# Patient Record
Sex: Male | Born: 1980 | Race: White | Hispanic: No | Marital: Married | Smoking: Never smoker
Health system: Southern US, Community
[De-identification: ages and names within clinical notes are randomized; demographics above are authoritative.]

## PROBLEM LIST (undated history)

## (undated) DIAGNOSIS — F419 Anxiety disorder, unspecified: Secondary | ICD-10-CM

## (undated) DIAGNOSIS — K259 Gastric ulcer, unspecified as acute or chronic, without hemorrhage or perforation: Secondary | ICD-10-CM

## (undated) DIAGNOSIS — K589 Irritable bowel syndrome without diarrhea: Secondary | ICD-10-CM

## (undated) DIAGNOSIS — K219 Gastro-esophageal reflux disease without esophagitis: Secondary | ICD-10-CM

## (undated) HISTORY — DX: Gastric ulcer, unspecified as acute or chronic, without hemorrhage or perforation: K25.9

## (undated) HISTORY — DX: Anxiety disorder, unspecified: F41.9

## (undated) HISTORY — DX: Gastro-esophageal reflux disease without esophagitis: K21.9

## (undated) HISTORY — PX: TONSILLECTOMY: SUR1361

---

## 2006-01-08 ENCOUNTER — Other Ambulatory Visit: Payer: Self-pay

## 2006-01-08 ENCOUNTER — Emergency Department: Payer: Self-pay | Admitting: Emergency Medicine

## 2006-07-22 ENCOUNTER — Emergency Department: Payer: Self-pay | Admitting: Emergency Medicine

## 2006-07-24 ENCOUNTER — Ambulatory Visit: Payer: Self-pay | Admitting: Emergency Medicine

## 2007-07-01 ENCOUNTER — Emergency Department: Payer: Self-pay | Admitting: Emergency Medicine

## 2007-12-23 ENCOUNTER — Ambulatory Visit: Payer: Self-pay | Admitting: Urology

## 2008-02-05 ENCOUNTER — Emergency Department: Payer: Self-pay | Admitting: Emergency Medicine

## 2008-02-06 ENCOUNTER — Other Ambulatory Visit: Payer: Self-pay

## 2008-03-17 ENCOUNTER — Ambulatory Visit: Payer: Self-pay | Admitting: Internal Medicine

## 2008-06-05 ENCOUNTER — Observation Stay: Payer: Self-pay | Admitting: Internal Medicine

## 2008-07-21 ENCOUNTER — Ambulatory Visit: Payer: Self-pay | Admitting: Internal Medicine

## 2008-09-26 ENCOUNTER — Emergency Department: Payer: Self-pay | Admitting: Internal Medicine

## 2008-09-29 ENCOUNTER — Ambulatory Visit: Payer: Self-pay | Admitting: Internal Medicine

## 2008-10-04 ENCOUNTER — Ambulatory Visit: Payer: Self-pay | Admitting: Internal Medicine

## 2011-10-30 ENCOUNTER — Emergency Department: Payer: Self-pay | Admitting: Emergency Medicine

## 2015-10-03 ENCOUNTER — Other Ambulatory Visit: Payer: Self-pay | Admitting: Internal Medicine

## 2015-10-03 ENCOUNTER — Ambulatory Visit: Payer: Self-pay

## 2015-10-03 DIAGNOSIS — R1031 Right lower quadrant pain: Secondary | ICD-10-CM

## 2015-11-16 ENCOUNTER — Encounter: Payer: Self-pay | Admitting: Urology

## 2015-11-16 ENCOUNTER — Ambulatory Visit (INDEPENDENT_AMBULATORY_CARE_PROVIDER_SITE_OTHER): Payer: Medicaid Other | Admitting: Urology

## 2015-11-16 VITALS — BP 144/76 | HR 70 | Ht 72.0 in | Wt 199.0 lb

## 2015-11-16 DIAGNOSIS — N503 Cyst of epididymis: Secondary | ICD-10-CM | POA: Diagnosis not present

## 2015-11-16 DIAGNOSIS — K219 Gastro-esophageal reflux disease without esophagitis: Secondary | ICD-10-CM | POA: Insufficient documentation

## 2015-11-16 NOTE — Progress Notes (Signed)
11/16/2015 10:07 AM   Cory Young 06-11-1981 [id number]  Referring provider: No referring provider defined for this encounter.  Chief Complaint  Patient presents with  . Groin Swelling    New Patient    HPI: The patient is a 35 year old gentleman presents for evaluation of a small lesion of his left testicle. He states that he first noticed an approximate 4 months ago. He describes it as above the size of a piece of rice. He states that when he grasped that he feels that it moves around within the scrotum. For instance, he was able to find it below his testicle but then it will then move to the side of his testicle. He does not think this was there before. He is concerned this might be a tumor. He also has been told that he has a right epididymal cyst. He has no complaints of pain from his testicles. 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   PMH: Past Medical History  Diagnosis Date  . GERD (gastroesophageal reflux disease)   . Anxiety   . Stomach ulcer     Surgical History: Past Surgical History  Procedure Laterality Date  . Tonsillectomy      Home Medications:    Medication List       This list is accurate as of: 11/16/15 10:07 AM.  Always use your most recent med list.               clonazePAM 1 MG tablet  Commonly known as:  KLONOPIN  Take 1 mg by mouth 2 (two) times daily as needed.     MULTI-VITAMINS Tabs  Take by mouth.     pantoprazole 40 MG tablet  Commonly known as:  PROTONIX  TAKE 1 TABLET (40 MG TOTAL) BY MOUTH ONCE DAILY.        Allergies:  Allergies  Allergen Reactions  . Penicillin G Anaphylaxis  . Amoxicillin     Other reaction(s): Unknown    Family History: Family History  Problem Relation Age of Onset  . Kidney cancer Neg Hx   . Prostate cancer Neg Hx   . Bladder Cancer Neg Hx   . Breast cancer Mother     Social History:  reports that he has never smoked.  He does not have any smokeless tobacco history on file. He reports that he does not drink alcohol or use illicit drugs.  ROS: UROLOGY Frequent Urination?: No Hard to postpone urination?: No Burning/pain with urination?: No Get up at night to urinate?: No Leakage of urine?: No Urine stream starts and stops?: No Trouble starting stream?: No Do you have to strain to urinate?: No Blood in urine?: No Urinary tract infection?: No Sexually transmitted disease?: No Injury to kidneys or bladder?: No Painful intercourse?: No Weak stream?: No Erection problems?: No Penile pain?: No  Gastrointestinal Nausea?: No Vomiting?: No Indigestion/heartburn?: Yes Diarrhea?: No Constipation?: No  Constitutional Fever: No Night sweats?: No Weight loss?: No Fatigue?: No  Skin Skin rash/lesions?: No Itching?: No  Eyes Blurred vision?: No Double vision?: No  Ears/Nose/Throat Sore throat?: No Sinus problems?: No  Hematologic/Lymphatic Swollen glands?: No Easy bruising?: No  Cardiovascular Leg swelling?: No Chest pain?: No  Respiratory Cough?: No Shortness of breath?: No  Endocrine Excessive thirst?: No  Musculoskeletal Back pain?: No Joint pain?: No  Neurological Headaches?: No Dizziness?: No  Psychologic Depression?: No Anxiety?: No  Physical Exam: BP 144/76  mmHg  Pulse 70  Ht 6' (1.829 m)  Wt 199 lb (90.266 kg)  BMI 26.98 kg/m2  Constitutional:  Alert and oriented, No acute distress. HEENT: [place] AT, moist mucus membranes.  Trachea midline, no masses. Cardiovascular: No clubbing, cyanosis, or edema. Respiratory: Normal respiratory effort, no increased work of breathing. GI: Abdomen is soft, nontender, nondistended, no abdominal masses GU: No CVA tenderness. Normal phallus. Testicles descended equally bilaterally. Small right epididymal cyst noted. I'm unable to palpate the lesion that he is concerned about in his left hemiscrotum. Skin: No rashes, bruises or  suspicious lesions. Lymph: No cervical or inguinal adenopathy. Neurologic: Grossly intact, no focal deficits, moving all 4 extremities. Psychiatric: Normal mood and affect.  Laboratory Data: No results found for: WBC, HGB, HCT, MCV, PLT  No results found for: CREATININE  No results found for: PSA  No results found for: TESTOSTERONE  No results found for: HGBA1C  Urinalysis No results found for: COLORURINE, APPEARANCEUR, LABSPEC, PHURINE, GLUCOSEU, HGBUR, BILIRUBINUR, KETONESUR, PROTEINUR, UROBILINOGEN, NITRITE, LEUKOCYTESUR   Assessment & Plan:    1. Right epididymal cyst 2. Questionable left extratesticular lesion I am unable to palpate the lesion in the left hemiscrotum the patient is concerned about. I will order a scrotal ultrasound at this time for reassurance. The patient will follow-up after the ultrasound.  Return in about 2 weeks (around 11/30/2015) for scrotal u/s prior.  Hildred LaserBrian James Harlie Buening, MD  [facility] [address], [address] [place], [place] [address] [phone]

## 2015-11-23 ENCOUNTER — Ambulatory Visit: Payer: Medicaid Other

## 2015-12-07 ENCOUNTER — Ambulatory Visit: Payer: Medicaid Other

## 2015-12-13 ENCOUNTER — Ambulatory Visit
Admission: RE | Admit: 2015-12-13 | Discharge: 2015-12-13 | Disposition: A | Discharge status: Home / Self Care | Payer: Medicaid Other | Source: Ambulatory Visit | Attending: Urology | Admitting: Urology

## 2015-12-13 DIAGNOSIS — N503 Cyst of epididymis: Secondary | ICD-10-CM | POA: Diagnosis present

## 2015-12-13 DIAGNOSIS — N433 Hydrocele, unspecified: Secondary | ICD-10-CM | POA: Diagnosis not present

## 2015-12-14 ENCOUNTER — Ambulatory Visit (INDEPENDENT_AMBULATORY_CARE_PROVIDER_SITE_OTHER): Payer: Medicaid Other | Admitting: Urology

## 2015-12-14 ENCOUNTER — Encounter: Payer: Self-pay | Admitting: Urology

## 2015-12-14 VITALS — BP 112/69 | HR 61 | Ht 72.0 in | Wt 200.2 lb

## 2015-12-14 DIAGNOSIS — N433 Hydrocele, unspecified: Secondary | ICD-10-CM | POA: Diagnosis not present

## 2015-12-14 NOTE — Progress Notes (Signed)
12/14/2015 9:35 AM   Cory Young [birth year] [id number]  Referring provider: Danella PentonMark F Miller, MD [phone] [facility] [place], [place] [id number]  Chief Complaint  Patient presents with  . Follow-up    US results     HPI: The patient is a 35 year old gentleman presents for evaluation of a small lesion of his left testicle. He states that he first noticed an approximate 4 months ago. He describes it as above the size of a piece of rice. He states that when he grasped that he feels that it moves around within the scrotum. For instance, he was able to find it below his testicle but then it will then move to the side of his testicle. He does not think this was there before. He is concerned this might be a tumor. He also has been told that he has a right epididymal cyst. He has no complaints of pain from his testicles. 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   The patient returns today after undergoing a scrotal ultrasound which was unremarkable.  His physical exam at his last plan was unremarkable.   PMH: Past Medical History  Diagnosis Date  . GERD (gastroesophageal reflux disease)   . Anxiety   . Stomach ulcer     Surgical History: Past Surgical History  Procedure Laterality Date  . Tonsillectomy      Home Medications:    Medication List       This list is accurate as of: 12/14/15  9:35 AM.  Always use your most recent med list.               clonazePAM 1 MG tablet  Commonly known as:  KLONOPIN  Take 1 mg by mouth 2 (two) times daily as needed.     MULTI-VITAMINS Tabs  Take by mouth.     pantoprazole 40 MG tablet  Commonly known as:  PROTONIX  TAKE 1 TABLET (40 MG TOTAL) BY MOUTH ONCE DAILY.        Allergies:  Allergies  Allergen Reactions  . Penicillin G Anaphylaxis  . Amoxicillin     Other reaction(s): Unknown  . Citalopram Other (See Comments)   Nightmares, sexual dysfunction.   . Sulfur Other (See Comments)    Blisters on throat    Family History: Family History  Problem Relation Age of Onset  . Kidney cancer Neg Hx   . Prostate cancer Neg Hx   . Bladder Cancer Neg Hx   . Breast cancer Mother     Social History:  reports that he has never smoked. He does not have any smokeless tobacco history on file. He reports that he does not drink alcohol or use illicit drugs.  ROS: UROLOGY Frequent Urination?: No Hard to postpone urination?: No Burning/pain with urination?: No Get up at night to urinate?: No Leakage of urine?: No Urine stream starts and stops?: No Trouble starting stream?: No Do you have to strain to urinate?: No Blood in urine?: No Urinary tract infection?: No Sexually transmitted disease?: No Injury to kidneys or bladder?: No Painful intercourse?: No Weak stream?: No Erection problems?: No Penile pain?: No  Gastrointestinal Nausea?: No Vomiting?: No Indigestion/heartburn?: Yes Diarrhea?: No Constipation?: No  Constitutional Fever: No Night sweats?: No Weight loss?: No Fatigue?: No  Skin Skin rash/lesions?: No Itching?: No  Eyes Blurred vision?: No Double vision?: No  Ears/Nose/Throat Sore throat?: No Sinus  problems?: No  Hematologic/Lymphatic Swollen glands?: No Easy bruising?: No  Cardiovascular Leg swelling?: No Chest pain?: No  Respiratory Cough?: No Shortness of breath?: No  Endocrine Excessive thirst?: No  Musculoskeletal Back pain?: No Joint pain?: No  Neurological Headaches?: No Dizziness?: No  Psychologic Depression?: No Anxiety?: No  Physical Exam: BP 112/69 mmHg  Pulse 61  Ht 6' (1.829 m)  Wt 200 lb 3.2 oz (90.81 kg)  BMI 27.15 kg/m2  Constitutional:  Alert and oriented, No acute distress. HEENT: [place] AT, moist mucus membranes.  Trachea midline, no masses. Cardiovascular: No clubbing, cyanosis, or edema. Respiratory: Normal respiratory effort, no  increased work of breathing. GI: Abdomen is soft, nontender, nondistended, no abdominal masses GU: No CVA tenderness.  Skin: No rashes, bruises or suspicious lesions. Lymph: No cervical or inguinal adenopathy. Neurologic: Grossly intact, no focal deficits, moving all 4 extremities. Psychiatric: Normal mood and affect.  Laboratory Data: No results found for: WBC, HGB, HCT, MCV, PLT  No results found for: CREATININE  No results found for: PSA  No results found for: TESTOSTERONE  No results found for: HGBA1C  Urinalysis No results found for: COLORURINE, APPEARANCEUR, LABSPEC, PHURINE, GLUCOSEU, HGBUR, BILIRUBINUR, KETONESUR, PROTEINUR, UROBILINOGEN, NITRITE, LEUKOCYTESUR  Pertinent Imaging: CLINICAL DATA: Epididymal cyst.  EXAM: SCROTAL ULTRASOUND  DOPPLER ULTRASOUND OF THE TESTICLES  TECHNIQUE: Complete ultrasound examination of the testicles, epididymis, and other scrotal structures was performed. Color and spectral Doppler ultrasound were also utilized to evaluate blood flow to the testicles.  COMPARISON: CT 09/29/2008. Ultrasound 12/22/2008.  FINDINGS: Right testicle  Measurements: 5.2 x 2.4 x 3.5 cm. No mass or microlithiasis visualized.  Left testicle  Measurements: 4.9 x 2.4 x 2.9 cm. No mass or microlithiasis visualized.  Right epididymis: Normal in size and appearance.  Left epididymis: Normal in size and appearance.  Hydrocele: Small left hydrocele. Left appendix testes noted.  Varicocele: None visualized.  Pulsed Doppler interrogation of both testes demonstrates normal low resistance arterial and venous waveforms bilaterally.  IMPRESSION: Small left hydrocele, otherwise negative exam.  Assessment & Plan:   1. Left hydrocele The patient's exam as well as scrotal ultrasound was unremarkable. There is no evidence of malignancy or other abnormalities besides a small left hydrocele. No further workup is needed. He can follow  up with [place] as needed.  No Follow-up on file.  Hildred Laser, MD  [facility] [address], [address] [place], [place] [address] [phone]

## 2016-03-27 ENCOUNTER — Encounter: Payer: Self-pay | Admitting: *Deleted

## 2016-03-28 ENCOUNTER — Ambulatory Visit: Payer: Medicaid Other | Admitting: Anesthesiology

## 2016-03-28 ENCOUNTER — Ambulatory Visit
Admission: RE | Admit: 2016-03-28 | Discharge: 2016-03-28 | Disposition: A | Discharge status: Home / Self Care | Payer: Medicaid Other | Source: Ambulatory Visit | Attending: Gastroenterology | Admitting: Gastroenterology

## 2016-03-28 ENCOUNTER — Encounter: Payer: Self-pay | Admitting: Gastroenterology

## 2016-03-28 ENCOUNTER — Encounter
Admission: RE | Disposition: A | Discharge status: Home / Self Care | Payer: Self-pay | Source: Ambulatory Visit | Attending: Gastroenterology

## 2016-03-28 DIAGNOSIS — Z9109 Other allergy status, other than to drugs and biological substances: Secondary | ICD-10-CM | POA: Insufficient documentation

## 2016-03-28 DIAGNOSIS — Z8711 Personal history of peptic ulcer disease: Secondary | ICD-10-CM | POA: Insufficient documentation

## 2016-03-28 DIAGNOSIS — R1031 Right lower quadrant pain: Secondary | ICD-10-CM | POA: Diagnosis present

## 2016-03-28 DIAGNOSIS — R1011 Right upper quadrant pain: Secondary | ICD-10-CM | POA: Insufficient documentation

## 2016-03-28 DIAGNOSIS — K589 Irritable bowel syndrome without diarrhea: Secondary | ICD-10-CM | POA: Diagnosis not present

## 2016-03-28 DIAGNOSIS — K625 Hemorrhage of anus and rectum: Secondary | ICD-10-CM | POA: Diagnosis present

## 2016-03-28 DIAGNOSIS — Z79899 Other long term (current) drug therapy: Secondary | ICD-10-CM | POA: Insufficient documentation

## 2016-03-28 DIAGNOSIS — Z880 Allergy status to penicillin: Secondary | ICD-10-CM | POA: Insufficient documentation

## 2016-03-28 DIAGNOSIS — K635 Polyp of colon: Secondary | ICD-10-CM | POA: Insufficient documentation

## 2016-03-28 DIAGNOSIS — K219 Gastro-esophageal reflux disease without esophagitis: Secondary | ICD-10-CM | POA: Insufficient documentation

## 2016-03-28 DIAGNOSIS — Z882 Allergy status to sulfonamides status: Secondary | ICD-10-CM | POA: Insufficient documentation

## 2016-03-28 DIAGNOSIS — F419 Anxiety disorder, unspecified: Secondary | ICD-10-CM | POA: Diagnosis not present

## 2016-03-28 HISTORY — DX: Irritable bowel syndrome, unspecified: K58.9

## 2016-03-28 HISTORY — PX: COLONOSCOPY WITH PROPOFOL: SHX5780

## 2016-03-28 SURGERY — COLONOSCOPY WITH PROPOFOL
Anesthesia: General

## 2016-03-28 MED ORDER — PROPOFOL 10 MG/ML IV BOLUS
INTRAVENOUS | Status: DC | PRN
  Administered 2016-03-28: 40 mg via INTRAVENOUS
  Administered 2016-03-28: 20 mg via INTRAVENOUS
  Administered 2016-03-28: 30 mg via INTRAVENOUS

## 2016-03-28 MED ORDER — FENTANYL CITRATE (PF) 100 MCG/2ML IJ SOLN
INTRAMUSCULAR | Status: DC | PRN
  Administered 2016-03-28 (×2): 50 ug via INTRAVENOUS

## 2016-03-28 MED ORDER — SODIUM CHLORIDE 0.9 % IV SOLN
INTRAVENOUS | Status: DC
  Administered 2016-03-28: 1000 mL via INTRAVENOUS
  Administered 2016-03-28: 13:00:00 via INTRAVENOUS

## 2016-03-28 MED ORDER — MIDAZOLAM HCL 2 MG/2ML IJ SOLN
INTRAMUSCULAR | Status: DC | PRN
  Administered 2016-03-28: 1 mg via INTRAVENOUS

## 2016-03-28 MED ORDER — SODIUM CHLORIDE 0.9 % IV SOLN: INTRAVENOUS | Status: DC

## 2016-03-28 MED ORDER — PROPOFOL 500 MG/50ML IV EMUL
INTRAVENOUS | Status: DC | PRN
  Administered 2016-03-28: 130 ug/kg/min via INTRAVENOUS

## 2016-03-28 NOTE — H&P (Signed)
Outpatient short stay form Pre-procedure 03/28/2016 12:21 PM Cory Young Shamya Macfadden MD  Primary Physician: Dr. Bethann PunchesMark Miller  Reason for visit:  Colonoscopy  History of present illness:  Patient is a 35 year old male presenting today as above. As a history of an episode of rectal bleeding several months ago accompanied by right upper quadrant to lower quadrant pain. It's occasional pain however has not seen a repeat bleeding. He has had a colonoscopy in the past and had an adenoma removed at that time. There is no family history of colon cancer. He tolerated his prep well. He takes no blood thinning medications or aspirin products.    Current Facility-Administered Medications:  .  0.9 %  sodium chloride infusion, , Intravenous, Continuous, Cory Young Orenthal Debski, MD, Last Rate: 20 mL/hr at 03/28/16 1045, 1,000 mL at 03/28/16 1045 .  0.9 %  sodium chloride infusion, , Intravenous, Continuous, Cory Young Arjay Jaskiewicz, MD  Prescriptions Prior to Admission  Medication Sig Dispense Refill Last Dose  . clonazePAM (KLONOPIN) 1 MG tablet Take 1 mg by mouth 2 (two) times daily as needed.  5 Past Week at Unknown time  . Multiple Vitamin (MULTI-VITAMINS) TABS Take by mouth.   Past Week at Unknown time  . pantoprazole (PROTONIX) 40 MG tablet TAKE 1 TABLET (40 MG TOTAL) BY MOUTH ONCE DAILY.  5 Past Week at Unknown time     Allergies  Allergen Reactions  . Penicillin G Anaphylaxis  . Amoxicillin     Other reaction(s): Unknown  . Citalopram Other (See Comments)    Nightmares, sexual dysfunction.   Marland Kitchen. Sulfur Other (See Comments)    Blisters on throat     Past Medical History:  Diagnosis Date  . Anxiety   . GERD (gastroesophageal reflux disease)   . IBS (irritable bowel syndrome)   . Stomach ulcer     Review of systems:      Physical Exam    Heart and lungs: Regular rate and rhythm without rub or gallop, lungs are bilaterally clear.    HEENT: Normocephalic atraumatic eyes are anicteric    Other:     Pertinant exam for procedure: Soft nontender nondistended bowel sounds positive normoactive.    Planned proceedures: Colonoscopy and indicated procedures. I have discussed the risks benefits and complications of procedures to include not limited to bleeding, infection, perforation and the risk of sedation and the patient wishes to proceed.    Cory Young Skyla Champagne, MD Gastroenterology 03/28/2016  12:21 PM

## 2016-03-28 NOTE — Anesthesia Preprocedure Evaluation (Addendum)
Anesthesia Evaluation  Patient identified by MRN, date of birth, ID band Patient awake    Reviewed: Allergy & Precautions, NPO status , Patient's Chart, lab work & pertinent test results  History of Anesthesia Complications Negative for: history of anesthetic complications  Airway Mallampati: II       Dental  (+) Teeth Intact   Pulmonary neg pulmonary ROS,    Pulmonary exam normal        Cardiovascular Exercise Tolerance: Good negative cardio ROS   Rhythm:Regular Rate:Normal     Neuro/Psych Anxiety negative neurological ROS     GI/Hepatic Neg liver ROS, GERD  Medicated,  Endo/Other  negative endocrine ROS  Renal/GU negative Renal ROS     Musculoskeletal negative musculoskeletal ROS (+)   Abdominal Normal abdominal exam  (+)   Peds negative pediatric ROS (+)  Hematology negative hematology ROS (+)   Anesthesia Other Findings   Reproductive/Obstetrics                             Anesthesia Physical Anesthesia Plan  ASA: II  Anesthesia Plan: General   Post-op Pain Management:    Induction: Intravenous  Airway Management Planned: Natural Airway and Nasal Cannula  Additional Equipment:   Intra-op Plan:   Post-operative Plan:   Informed Consent: I have reviewed the patients History and Physical, chart, labs and discussed the procedure including the risks, benefits and alternatives for the proposed anesthesia with the patient or authorized representative who has indicated his/her understanding and acceptance.     Plan Discussed with:   Anesthesia Plan Comments:         Anesthesia Quick Evaluation

## 2016-03-28 NOTE — Anesthesia Postprocedure Evaluation (Signed)
Anesthesia Post Note  Patient: Sarajane JewsSteven M Aaberg  Procedure(s) Performed: Procedure(s) (LRB): COLONOSCOPY WITH PROPOFOL (N/A)  Patient location during evaluation: PACU Anesthesia Type: General Level of consciousness: awake Pain management: pain level controlled Vital Signs Assessment: post-procedure vital signs reviewed and stable Respiratory status: spontaneous breathing Cardiovascular status: stable Anesthetic complications: no    Last Vitals:  Vitals:   03/28/16 1320 03/28/16 1330  BP: 126/81 127/88  Pulse: (!) 58 (!) 54  Resp: 13 12  Temp:      Last Pain:  Vitals:   03/28/16 1310  TempSrc: Oral                 VAN STAVEREN,Nils Thor

## 2016-03-28 NOTE — Transfer of Care (Signed)
Immediate Anesthesia Transfer of Care Note  Patient: Cory Young  Procedure(s) Performed: Procedure(s): COLONOSCOPY WITH PROPOFOL (N/A)  Patient Location: PACU  Anesthesia Type:General  Level of Consciousness: awake, alert  and oriented  Airway & Oxygen Therapy: Patient Spontanous Breathing and Patient connected to nasal cannula oxygen  Post-op Assessment: Report given to RN and Post -op Vital signs reviewed and stable  Post vital signs: Reviewed  Last Vitals:  Vitals:   03/28/16 1024  BP: 135/83  Pulse: 73  Resp: 16  Temp: 36.2 C    Last Pain:  Vitals:   03/28/16 1024  TempSrc: Tympanic         Complications: No apparent anesthesia complications

## 2016-03-28 NOTE — Op Note (Signed)
[facility] Gastroenterology Patient Name: Cory Young Procedure Date: 03/28/2016 12:24 PM MRN: [id number] Account #: [id number] Date of Birth: [birth year] Admit Type: Outpatient Age: 35 Room: [facility] ENDO ROOM 4 Gender: Male Note Status: Finalized Procedure:            Colonoscopy Indications:          Abdominal pain in the right lower quadrant, Abdominal                        pain in the right upper quadrant, Rectal bleeding Providers:            Christena Deem, MD Referring MD:         Danella Penton, MD (Referring MD) Medicines:            Monitored Anesthesia Care Complications:        No immediate complications. Procedure:            Pre-Anesthesia Assessment:                       - ASA Grade Assessment: II - A patient with mild                        systemic disease.                       After obtaining informed consent, the colonoscope was                        passed under direct vision. Throughout the procedure,                        the patient's blood pressure, pulse, and oxygen                        saturations were monitored continuously. The                        Colonoscope was introduced through the anus and                        advanced to the the cecum, identified by appendiceal                        orifice and ileocecal valve. The patient tolerated the                        procedure well. The quality of the bowel preparation                        was good. Findings:      A 2 mm polyp was found in the appendiceal orifice. The polyp was       sessile. The polyp was removed with a cold biopsy forceps. Resection and       retrieval were complete.      The exam was otherwise normal throughout the examined colon.      The retroflexed view of the distal rectum and anal verge was normal and       showed no anal or rectal abnormalities.      The digital rectal exam was normal.  The terminal ileum appeared normal. Impression:            - One 2 mm polyp at the appendiceal orifice, removed                        with a cold biopsy forceps. Resected and retrieved.                       - The distal rectum and anal verge are normal on                        retroflexion view. Recommendation:       - Use Citrucel one tablespoon PO daily daily.                       - Return to GI clinic in 1 month. 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 All rights reserved. The codes documented in this report are preliminary and upon coder review may  be revised to meet current compliance requirements. Christena DeemMartin U Tisa Weisel, MD 03/28/2016 1:10:03 PM This report has been signed electronically. Number of Addenda: 0 Note Initiated On: 03/28/2016 12:24 PM Scope Withdrawal Time: 0 hours 15 minutes 48 seconds  Total Procedure Duration: 0 hours 26 minutes 13 seconds       [facility]

## 2016-03-29 LAB — SURGICAL PATHOLOGY

## 2016-11-02 ENCOUNTER — Emergency Department
Admission: EM | Admit: 2016-11-02 | Discharge: 2016-11-02 | Disposition: A | Discharge status: Home / Self Care | Payer: Medicaid Other | Attending: Emergency Medicine | Admitting: Emergency Medicine

## 2016-11-02 ENCOUNTER — Emergency Department: Payer: Medicaid Other

## 2016-11-02 ENCOUNTER — Encounter: Payer: Self-pay | Admitting: Emergency Medicine

## 2016-11-02 DIAGNOSIS — R05 Cough: Secondary | ICD-10-CM | POA: Diagnosis present

## 2016-11-02 DIAGNOSIS — J069 Acute upper respiratory infection, unspecified: Secondary | ICD-10-CM | POA: Insufficient documentation

## 2016-11-02 DIAGNOSIS — Z79899 Other long term (current) drug therapy: Secondary | ICD-10-CM | POA: Insufficient documentation

## 2016-11-02 LAB — POCT RAPID STREP A: STREPTOCOCCUS, GROUP A SCREEN (DIRECT): NEGATIVE

## 2016-11-02 MED ORDER — GUAIFENESIN-CODEINE 100-10 MG/5ML PO SYRP: 5.0000 mL | ORAL_SOLUTION | Freq: Three times a day (TID) | ORAL | 0 refills | Status: AC | PRN

## 2016-11-02 MED ORDER — ALBUTEROL SULFATE HFA 108 (90 BASE) MCG/ACT IN AERS: 2.0000 | INHALATION_SPRAY | Freq: Four times a day (QID) | RESPIRATORY_TRACT | 0 refills | Status: AC | PRN

## 2016-11-02 MED ORDER — IPRATROPIUM-ALBUTEROL 0.5-2.5 (3) MG/3ML IN SOLN
3.0000 mL | Freq: Once | RESPIRATORY_TRACT | Status: AC
  Administered 2016-11-02: 3 mL via RESPIRATORY_TRACT

## 2016-11-02 MED ORDER — PREDNISONE 10 MG (21) PO TBPK: ORAL_TABLET | ORAL | 0 refills | Status: AC

## 2016-11-02 MED ORDER — LIDOCAINE VISCOUS 2 % MT SOLN: 10.0000 mL | OROMUCOSAL | 0 refills | Status: AC | PRN

## 2016-11-02 MED ORDER — LIDOCAINE VISCOUS 2 % MT SOLN
15.0000 mL | Freq: Once | OROMUCOSAL | Status: AC
  Administered 2016-11-02: 15 mL via OROMUCOSAL
  Filled 2016-11-02: qty 15

## 2016-11-02 MED ORDER — IPRATROPIUM-ALBUTEROL 0.5-2.5 (3) MG/3ML IN SOLN
3.0000 mL | Freq: Once | RESPIRATORY_TRACT | Status: AC
Start: 2016-11-02 — End: 2016-11-02
  Administered 2016-11-02: 3 mL via RESPIRATORY_TRACT
  Filled 2016-11-02: qty 3

## 2016-11-02 NOTE — ED Notes (Signed)
ED Provider at bedside. 

## 2016-11-02 NOTE — ED Provider Notes (Signed)
[facility] Emergency Department Provider Note  ____________________________________________  Time seen: Approximately 8:10 AM  I have reviewed the triage vital signs and the nursing notes.   HISTORY  Chief Complaint Sore Throat; Cough; and Nasal Congestion    HPI Cory Young is a 36 y.o. male that presents to the emergency department with one week of sore throat, nasal congestion, and cough. Cough is nonproductive. Patient was seen yesterday by Dr. Hyacinth Meeker and was given a Z-Pak. He was not able to get it filled yesterday and was planning to get it filled this morning. This morning he was having trouble catching his breath and he could hear himself wheezing. He works outside a lot and is wondering if he is working too hard and making his allergies worse. His wife is a Engineer, civil (consulting) who he has been monitoring his oxygen at home and it has been 100. He states that his wife is upset that he does not wear a mask while working outside and thinks this is contributing to his illness. He is his own boss and does not need a work note. He does not smoke. He denies fever, nausea, vomiting, abdominal pain, diarrhea, constipation.   Past Medical History:  Diagnosis Date  . Anxiety   . GERD (gastroesophageal reflux disease)   . IBS (irritable bowel syndrome)   . Stomach ulcer     Patient Active Problem List   Diagnosis Date Noted  . Acid reflux 11/16/2015    Past Surgical History:  Procedure Laterality Date  . COLONOSCOPY WITH PROPOFOL N/A 03/28/2016   Procedure: COLONOSCOPY WITH PROPOFOL;  Surgeon: Christena Deem, MD;  Location: [facility] ENDOSCOPY;  Service: Endoscopy;  Laterality: N/A;  . TONSILLECTOMY      Prior to Admission medications   Medication Sig Start Date End Date Taking? Authorizing Provider  albuterol (PROVENTIL HFA;VENTOLIN HFA) 108 (90 Base) MCG/ACT inhaler Inhale 2 puffs into the lungs every 6 (six) hours as needed for wheezing or shortness of breath.  11/02/16   Enid Derry, PA-C  clonazePAM (KLONOPIN) 1 MG tablet Take 1 mg by mouth 2 (two) times daily as needed. 09/15/15   Historical Provider, MD  guaiFENesin-codeine (ROBITUSSIN AC) 100-10 MG/5ML syrup Take 5 mLs by mouth 3 (three) times daily as needed for cough. 11/02/16 11/04/16  Enid Derry, PA-C  lidocaine (XYLOCAINE) 2 % solution Use as directed 10 mLs in the mouth or throat as needed for mouth pain. 11/02/16   Enid Derry, PA-C  Multiple Vitamin (MULTI-VITAMINS) TABS Take by mouth.    Historical Provider, MD  pantoprazole (PROTONIX) 40 MG tablet TAKE 1 TABLET (40 MG TOTAL) BY MOUTH ONCE DAILY. 10/11/15   Historical Provider, MD  predniSONE (STERAPRED UNI-PAK 21 TAB) 10 MG (21) TBPK tablet Take 6 tablets on day 1, take 5 tablets on day 2, take 4 tablets on day 3, take 3 tablets on day 4, take 2 tablets on day 5, take 1 tablet on day 6 11/02/16   Enid Derry, PA-C    Allergies Penicillin g; Amoxicillin; Citalopram; and Sulfur  Family History  Problem Relation Age of Onset  . Breast cancer Mother   . Kidney cancer Neg Hx   . Prostate cancer Neg Hx   . Bladder Cancer Neg Hx     Social History Social History  Substance Use Topics  . Smoking status: Never Smoker  . Smokeless tobacco: Never Used  . Alcohol use No     Review of Systems  Constitutional: No fever/chills Eyes:  No visual changes. No discharge. ENT: Positive for congestion and rhinorrhea. Cardiovascular: No chest pain. Respiratory: Positive for cough.  Gastrointestinal: No abdominal pain.  No nausea, no vomiting.   Musculoskeletal: Negative for musculoskeletal pain. Skin: Negative for rash, abrasions, lacerations, ecchymosis. Neurological: Negative for headaches.   ____________________________________________   PHYSICAL EXAM:  VITAL SIGNS: ED Triage Vitals  Enc Vitals Group     BP 11/02/16 0703 (!) 141/100     Pulse Rate 11/02/16 0703 99     Resp 11/02/16 0703 20     Temp 11/02/16 0703 97.5 F (36.4  C)     Temp Source 11/02/16 0703 Oral     SpO2 11/02/16 0703 100 %     Weight 11/02/16 0704 220 lb (99.8 kg)     Height 11/02/16 0704 6' (1.829 m)     Head Circumference --      Peak Flow --      Pain Score 11/02/16 0703 8     Pain Loc --      Pain Edu? --      Excl. in GC? --      Constitutional: Alert and oriented. Well appearing and in no acute distress. Eyes: Conjunctivae are normal. PERRL. EOMI. No discharge. Head: Atraumatic. ENT: No frontal and maxillary sinus tenderness.      Ears: Tympanic membranes pearly gray with good landmarks. No discharge.      Nose: Mild congestion/rhinnorhea.      Mouth/Throat: Mucous membranes are moist. Oropharynx non-erythematous. Tonsils not enlarged. No exudates. Uvula midline. Neck: No stridor.   Hematological/Lymphatic/Immunilogical: No cervical lymphadenopathy. Cardiovascular: Normal rate, regular rhythm.  Good peripheral circulation. Respiratory: Normal respiratory effort without tachypnea or retractions. Lungs CTAB. Good air entry to the bases with no decreased or absent breath sounds. Gastrointestinal: Bowel sounds 4 quadrants. Soft and nontender to palpation. No guarding or rigidity. No palpable masses. No distention. Musculoskeletal: Full range of motion to all extremities. No gross deformities appreciated. Neurologic:  Normal speech and language. No gross focal neurologic deficits are appreciated.  Skin:  Skin is warm, dry and intact. No rash noted.   ____________________________________________   LABS (all labs ordered are listed, but only abnormal results are displayed)  Labs Reviewed  CULTURE, GROUP A STREP [facility])  POCT RAPID STREP A   ____________________________________________  EKG   ____________________________________________  RADIOLOGY Lexine Baton, personally viewed and evaluated these images (plain radiographs) as part of my medical decision making, as well as reviewing the written report by the  radiologist.  Dg Chest 2 View  Result Date: 11/02/2016 CLINICAL DATA:  Patient with cough and sore throat for 2 days. EXAM: CHEST  2 VIEW COMPARISON:  Chest radiograph 09/26/2008 FINDINGS: Normal cardiac and mediastinal contours. No consolidative pulmonary opacities. No pleural effusion or or pneumothorax. Regional skeleton is unremarkable. IMPRESSION: No acute cardiopulmonary process. Electronically Signed   By: Annia Belt M.D.   On: 11/02/2016 08:09    ____________________________________________    PROCEDURES  Procedure(s) performed:    Procedures    Medications  ipratropium-albuterol (DUONEB) 0.5-2.5 (3) MG/3ML nebulizer solution 3 mL (3 mLs Nebulization Given 11/02/16 0753)  lidocaine (XYLOCAINE) 2 % viscous mouth solution 15 mL (15 mLs Mouth/Throat Given 11/02/16 0819)  ipratropium-albuterol (DUONEB) 0.5-2.5 (3) MG/3ML nebulizer solution 3 mL (3 mLs Nebulization Given 11/02/16 0846)     ____________________________________________   INITIAL IMPRESSION / ASSESSMENT AND PLAN / ED COURSE  Pertinent labs & imaging results that were available during my care of the patient  were reviewed by me and considered in my medical decision making (see chart for details).  Review of the [place] CSRS was performed in accordance of the [facility] prior to dispensing any controlled drugs.     Patient's diagnosis is consistent with upper respiratory infection. Vital signs and exam are reassuring. Chest x-ray negative for acute cardiopulmonary processes. Strep negative. Patient received 2 DuoNebs and viscous lidocaine in ED. Patient appears well and is staying well hydrated. Patient already has a prescription for azithromycin, which he will fill this morning. Patient will be discharged home with prescriptions for prednisone, albuterol inhaler, Robitussin, viscous lidocaine. Patient is to follow up with PCP as needed or otherwise directed. Patient is given ED precautions to return to the ED for any  worsening or new symptoms.     ____________________________________________  FINAL CLINICAL IMPRESSION(S) / ED DIAGNOSES  Final diagnoses:  Upper respiratory tract infection, unspecified type      NEW MEDICATIONS STARTED DURING THIS VISIT:  Discharge Medication List as of 11/02/2016  9:10 AM    START taking these medications   Details  albuterol (PROVENTIL HFA;VENTOLIN HFA) 108 (90 Base) MCG/ACT inhaler Inhale 2 puffs into the lungs every 6 (six) hours as needed for wheezing or shortness of breath., Starting Sat 11/02/2016, Print    guaiFENesin-codeine (ROBITUSSIN AC) 100-10 MG/5ML syrup Take 5 mLs by mouth 3 (three) times daily as needed for cough., Starting Sat 11/02/2016, Until Mon 11/04/2016, Print    lidocaine (XYLOCAINE) 2 % solution Use as directed 10 mLs in the mouth or throat as needed for mouth pain., Starting Sat 11/02/2016, Print    predniSONE (STERAPRED UNI-PAK 21 TAB) 10 MG (21) TBPK tablet Take 6 tablets on day 1, take 5 tablets on day 2, take 4 tablets on day 3, take 3 tablets on day 4, take 2 tablets on day 5, take 1 tablet on day 6, Print            This chart was dictated using voice recognition software/Dragon. Despite best efforts to proofread, errors can occur which can change the meaning. Any change was purely unintentional.    Enid Derry, PA-C 11/02/16 0933    Enid Derry, PA-C 11/02/16 1610    Jene Every, MD 11/02/16 1036

## 2016-11-02 NOTE — ED Triage Notes (Signed)
Pt to ed with c/o sore throat, cough, congestion x 1 week. Pt states was seen yesterday at [facility], given z pack but did not get that filled.  Pt states this am started coughing and then felt like he couldn't get his breath.  Pt in no acute resp distress at triage.  Able to speak in full complete sentences.  No audible wheezing noted. sats 100%

## 2016-11-04 LAB — CULTURE, GROUP A STREP (THRC)

## 2017-01-16 ENCOUNTER — Emergency Department
Admission: EM | Admit: 2017-01-16 | Discharge: 2017-01-16 | Disposition: A | Discharge status: Home / Self Care | Payer: Medicaid Other | Attending: Student in an Organized Health Care Education/Training Program | Admitting: Student in an Organized Health Care Education/Training Program

## 2017-01-16 ENCOUNTER — Emergency Department: Payer: Medicaid Other

## 2017-01-16 ENCOUNTER — Encounter: Payer: Self-pay | Admitting: Emergency Medicine

## 2017-01-16 DIAGNOSIS — R51 Headache: Secondary | ICD-10-CM | POA: Insufficient documentation

## 2017-01-16 DIAGNOSIS — Z79899 Other long term (current) drug therapy: Secondary | ICD-10-CM | POA: Diagnosis not present

## 2017-01-16 DIAGNOSIS — Y939 Activity, unspecified: Secondary | ICD-10-CM | POA: Insufficient documentation

## 2017-01-16 DIAGNOSIS — Y998 Other external cause status: Secondary | ICD-10-CM | POA: Insufficient documentation

## 2017-01-16 DIAGNOSIS — S0101XA Laceration without foreign body of scalp, initial encounter: Secondary | ICD-10-CM | POA: Diagnosis not present

## 2017-01-16 DIAGNOSIS — Y929 Unspecified place or not applicable: Secondary | ICD-10-CM | POA: Insufficient documentation

## 2017-01-16 DIAGNOSIS — W228XXA Striking against or struck by other objects, initial encounter: Secondary | ICD-10-CM | POA: Diagnosis not present

## 2017-01-16 MED ORDER — KETOROLAC TROMETHAMINE 30 MG/ML IJ SOLN
30.0000 mg | Freq: Once | INTRAMUSCULAR | Status: AC
  Administered 2017-01-16: 30 mg via INTRAMUSCULAR
  Filled 2017-01-16: qty 1

## 2017-01-16 MED ORDER — KETOROLAC TROMETHAMINE 10 MG PO TABS
10.0000 mg | ORAL_TABLET | Freq: Four times a day (QID) | ORAL | 0 refills | Status: AC | PRN
Start: 2017-01-16 — End: 2017-01-21

## 2017-01-16 NOTE — ED Triage Notes (Signed)
Pt was mowing grass and went to get off mower and tin roof on barn cut side of left head. No LOC.  C/o pain to head and some to neck. Ambulatory to triage.  No active bleeding. Tdap up to date.

## 2017-01-16 NOTE — ED Provider Notes (Signed)
[facility] Emergency Department Provider Note  ____________________________________________  Time seen: Approximately 6:34 PM  I have reviewed the triage vital signs and the nursing notes.   HISTORY  Chief Complaint Laceration    HPI Cory Young is a 36 y.o. male presenting to the emergency department with a 1 cm scalp laceration after hitting a tin roof on the side of a barn. No loss of consciousness. Patient states that he has also experienced 7 out of 10 headache and neck pain. He denies radiculopathy, weakness, blurry vision, nausea and vomiting. No alleviating measures have been attempted.    Past Medical History:  Diagnosis Date  . Anxiety   . GERD (gastroesophageal reflux disease)   . IBS (irritable bowel syndrome)   . Stomach ulcer     Patient Active Problem List   Diagnosis Date Noted  . Acid reflux 11/16/2015    Past Surgical History:  Procedure Laterality Date  . COLONOSCOPY WITH PROPOFOL N/A 03/28/2016   Procedure: COLONOSCOPY WITH PROPOFOL;  Surgeon: Christena Deem, MD;  Location: [facility] ENDOSCOPY;  Service: Endoscopy;  Laterality: N/A;  . TONSILLECTOMY      Prior to Admission medications   Medication Sig Start Date End Date Taking? Authorizing Provider  albuterol (PROVENTIL HFA;VENTOLIN HFA) 108 (90 Base) MCG/ACT inhaler Inhale 2 puffs into the lungs every 6 (six) hours as needed for wheezing or shortness of breath. 11/02/16   Enid Derry, PA-C  clonazePAM (KLONOPIN) 1 MG tablet Take 1 mg by mouth 2 (two) times daily as needed. 09/15/15   [provider]  ketorolac (TORADOL) 10 MG tablet Take 1 tablet (10 mg total) by mouth every 6 (six) hours as needed. 01/16/17 01/21/17  Orvil Feil, PA-C  lidocaine (XYLOCAINE) 2 % solution Use as directed 10 mLs in the mouth or throat as needed for mouth pain. 11/02/16   Enid Derry, PA-C  Multiple Vitamin (MULTI-VITAMINS) TABS Take by mouth.    [provider]   pantoprazole (PROTONIX) 40 MG tablet TAKE 1 TABLET (40 MG TOTAL) BY MOUTH ONCE DAILY. 10/11/15   [provider]  predniSONE (STERAPRED UNI-PAK 21 TAB) 10 MG (21) TBPK tablet Take 6 tablets on day 1, take 5 tablets on day 2, take 4 tablets on day 3, take 3 tablets on day 4, take 2 tablets on day 5, take 1 tablet on day 6 11/02/16   Enid Derry, PA-C    Allergies Penicillin g; Amoxicillin; Citalopram; and Sulfur  Family History  Problem Relation Age of Onset  . Breast cancer Mother   . Kidney cancer Neg Hx   . Prostate cancer Neg Hx   . Bladder Cancer Neg Hx     Social History Social History  Substance Use Topics  . Smoking status: Never Smoker  . Smokeless tobacco: Never Used  . Alcohol use No     Review of Systems  Constitutional: No fever/chills Eyes: No visual changes. No discharge ENT: No upper respiratory complaints. Cardiovascular: no chest pain. Respiratory: no cough. No SOB. Gastrointestinal: No abdominal pain.  No nausea, no vomiting.  No diarrhea.  No constipation. Musculoskeletal: Patient has neck pain.  Skin: Patient has scalp laceration.  Neurological: Patient has headache, no focal weakness or numbness.   ____________________________________________   PHYSICAL EXAM:  VITAL SIGNS: ED Triage Vitals  Enc Vitals Group     BP 01/16/17 1740 (!) 143/103     Pulse Rate 01/16/17 1740 (!) 56     Resp 01/16/17 1740 16  Temp 01/16/17 1740 97.7 F (36.5 C)     Temp Source 01/16/17 1740 Oral     SpO2 01/16/17 1740 100 %     Weight 01/16/17 1735 225 lb (102.1 kg)     Height 01/16/17 1735 6' (1.829 m)     Head Circumference --      Peak Flow --      Pain Score 01/16/17 1735 7     Pain Loc --      Pain Edu? --      Excl. in GC? --      Constitutional: Alert and oriented. Patient is talkative and engaged.  Eyes: Palpebral and bulbar conjunctiva are nonerythematous bilaterally. PERRL. EOMI.  Head: Atraumatic. ENT:      Ears: Tympanic  membranes are pearly bilaterally without bloody effusion visualized.       Nose: Nasal septum is midline without evidence of blood or septal hematoma.      Mouth/Throat: Mucous membranes are moist. Uvula is midline. Neck: Full range of motion. No pain with neck flexion. No pain with palpation of the cervical spine.  Cardiovascular: No pain with palpation over the anterior and posterior chest wall. Normal rate, regular rhythm. Normal S1 and S2. No murmurs, gallops or rubs auscultated.  Respiratory: Trachea is midline. Resonant and symmetric percussion tones bilaterally. On auscultation, adventitious sounds are absent.  Musculoskeletal: Patient has 5/5 strength in the upper and lower extremities bilaterally. Full range of motion at the shoulder, elbow and wrist bilaterally. Full range of motion at the hip, knee and ankle bilaterally. No changes in gait. Palpable radial, ulnar and dorsalis pedis pulses bilaterally and symmetrically. Neurologic: Normal speech and language. No gross focal neurologic deficits are appreciated. Cranial nerves: 2-10 normal as tested. Cerebellar: Finger-nose-finger WNL, heel to shin WNL. Sensorimotor: No sensory loss or abnormal reflexes. Vision: No visual field deficts noted to confrontation.  Speech: No dysarthria or expressive aphasia.  Skin:  Patient has 1 cm scalp laceration. Psychiatric: Mood and affect are normal for age. Speech and behavior are normal.    ____________________________________________   LABS (all labs ordered are listed, but only abnormal results are displayed)  Labs Reviewed - No data to display ____________________________________________  EKG   ____________________________________________  RADIOLOGY   Dg Cervical Spine 2-3 Views  Result Date: 01/16/2017 CLINICAL DATA:  Hit metal roof mowing the grass.  Work injury. EXAM: CERVICAL SPINE - 2-3 VIEW COMPARISON:  None. FINDINGS: Cervical spine alignment is maintained. Vertebral body  heights and intervertebral disc spaces are preserved. The dens is intact. Posterior elements appear well-aligned. There is no evidence of fracture. No prevertebral soft tissue edema. IMPRESSION: Negative cervical spine radiographs. Electronically Signed   By: Rubye OaksMelanie  Ehinger M.D.   On: 01/16/2017 18:48    ____________________________________________    PROCEDURES  Procedure(s) performed:    Procedures  LACERATION REPAIR Performed by: Orvil FeilJaclyn M Woods Authorized by: Orvil FeilJaclyn M Woods Consent: Verbal consent obtained. Risks and benefits: risks, benefits and alternatives were discussed Consent given by: patient Patient identity confirmed: provided demographic data Prepped and Draped in normal sterile fashion Wound explored  Laceration Location: Left temple   Laceration Length: 1 cm  No Foreign Bodies seen or palpated  Anesthesia: local infiltration  Local anesthetic: lidocaine 1 % 2 epinephrine  Anesthetic total: 2 ml  Irrigation method: syringe Amount of cleaning: standard  Skin closure: Staple  Number of staples: 1  Patient tolerance: Patient tolerated the procedure well with no immediate complications.   Medications  ketorolac (  TORADOL) 30 MG/ML injection 30 mg (30 mg Intramuscular Given 01/16/17 1847)     ____________________________________________   INITIAL IMPRESSION / ASSESSMENT AND PLAN / ED COURSE  Pertinent labs & imaging results that were available during my care of the patient were reviewed by me and considered in my medical decision making (see chart for details).  Review of the [place] CSRS was performed in accordance of the [facility] prior to dispensing any controlled drugs.     Assessment and Plan:  Scalp laceration Patient presents to the emergency department with a 1 cm scalp laceration. Patient secondarily reported headache and neck pain. DG cervical spine revealed no acute fractures or bony abnormalities. An injection of Toradol was given in the  emergency department for pain. Patient underwent scalp laceration repair without complication. Patient was discharged with PO Toradol. She was advised to have sutures removed by primary care in 5 days. All patient questions were answered.   ____________________________________________  FINAL CLINICAL IMPRESSION(S) / ED DIAGNOSES  Final diagnoses:  Laceration of scalp, initial encounter      NEW MEDICATIONS STARTED DURING THIS VISIT:  New Prescriptions   KETOROLAC (TORADOL) 10 MG TABLET    Take 1 tablet (10 mg total) by mouth every 6 (six) hours as needed.        This chart was dictated using voice recognition software/Dragon. Despite best efforts to proofread, errors can occur which can change the meaning. Any change was purely unintentional.    Orvil Feil, PA-C 01/16/17 1910    Willy Eddy, MD 01/17/17 [phone]

## 2019-04-11 ENCOUNTER — Emergency Department
Admission: EM | Admit: 2019-04-11 | Discharge: 2019-04-11 | Disposition: A | Discharge status: Home / Self Care | Payer: Medicaid Other | Attending: Emergency Medicine | Admitting: Emergency Medicine

## 2019-04-11 ENCOUNTER — Emergency Department: Payer: Medicaid Other

## 2019-04-11 ENCOUNTER — Other Ambulatory Visit: Payer: Self-pay

## 2019-04-11 DIAGNOSIS — Y929 Unspecified place or not applicable: Secondary | ICD-10-CM | POA: Insufficient documentation

## 2019-04-11 DIAGNOSIS — Y939 Activity, unspecified: Secondary | ICD-10-CM | POA: Diagnosis not present

## 2019-04-11 DIAGNOSIS — S6991XA Unspecified injury of right wrist, hand and finger(s), initial encounter: Secondary | ICD-10-CM | POA: Diagnosis present

## 2019-04-11 DIAGNOSIS — W271XXA Contact with garden tool, initial encounter: Secondary | ICD-10-CM | POA: Diagnosis not present

## 2019-04-11 DIAGNOSIS — S61214A Laceration without foreign body of right ring finger without damage to nail, initial encounter: Secondary | ICD-10-CM | POA: Insufficient documentation

## 2019-04-11 DIAGNOSIS — Y999 Unspecified external cause status: Secondary | ICD-10-CM | POA: Diagnosis not present

## 2019-04-11 MED ORDER — LIDOCAINE HCL 1 % IJ SOLN
5.0000 mL | Freq: Once | INTRAMUSCULAR | Status: DC
Start: 2019-04-11 — End: 2019-04-12
  Filled 2019-04-11: qty 10

## 2019-04-11 MED ORDER — DOXYCYCLINE MONOHYDRATE 100 MG PO TABS: 100.0000 mg | ORAL_TABLET | Freq: Two times a day (BID) | ORAL | 0 refills | Status: AC

## 2019-04-11 NOTE — Discharge Instructions (Signed)
Take doxy twice daily for the next seven days.

## 2019-04-11 NOTE — ED Triage Notes (Signed)
Pt with laceration to right 4th finger from hedge clipper. Pt with laceration approx 2 inch noted to but cms is intact. Pt states has had ETOH this pm. Bleeding controlled with pressure.

## 2019-04-11 NOTE — ED Provider Notes (Signed)
[facility] Emergency Department Provider Note  ____________________________________________  Time seen: Approximately 11:08 PM  I have reviewed the triage vital signs and the nursing notes.   HISTORY  Chief Complaint Laceration    HPI Sarajane JewsSteven M Vonada is a 38 y.o. male presents to the emergency department with a 2 cm laceration sustained to the volar aspect of the right fourth digit after patient nipped finger with a hedge clipper.  Patient denies numbness or tingling of the right hand.  He reports that his tetanus status is up-to-date.  No other alleviating measures have been attempted.        Past Medical History:  Diagnosis Date  . Anxiety   . GERD (gastroesophageal reflux disease)   . IBS (irritable bowel syndrome)   . Stomach ulcer     Patient Active Problem List   Diagnosis Date Noted  . Acid reflux 11/16/2015    Past Surgical History:  Procedure Laterality Date  . COLONOSCOPY WITH PROPOFOL N/A 03/28/2016   Procedure: COLONOSCOPY WITH PROPOFOL;  Surgeon: Christena DeemMartin U Skulskie, MD;  [facility] END[facility];  Service: Endoscopy;  Laterality: N/A;  . TONSILLECTOMY      Prior to Admission medications   Medication Sig Start Date End Date Taking? Authorizing Provider  albuterol (PROVENTIL HFA;VENTOLIN HFA) 108 (90 Base) MCG/ACT inhaler Inhale 2 puffs into the lungs every 6 (six) hours as needed for wheezing or shortness of breath. 11/02/16   Enid DerryWagner, Ashley, PA-C  clonazePAM (KLONOPIN) 1 MG tablet Take 1 mg by mouth 2 (two) times daily as needed. 09/15/15   [provider]  doxycycline (ADOXA) 100 MG tablet Take 1 tablet (100 mg total) by mouth 2 (two) times daily for 7 days. 04/11/19 04/18/19  Orvil FeilWoods, Brandonlee Navis M, PA-C  lidocaine (XYLOCAINE) 2 % solution Use as directed 10 mLs in the mouth or throat as needed for mouth pain. 11/02/16   Enid DerryWagner, Ashley, PA-C  Multiple Vitamin (MULTI-VITAMINS) TABS Take by mouth.    [provider]   pantoprazole (PROTONIX) 40 MG tablet TAKE 1 TABLET (40 MG TOTAL) BY MOUTH ONCE DAILY. 10/11/15   [provider]  predniSONE (STERAPRED UNI-PAK 21 TAB) 10 MG (21) TBPK tablet Take 6 tablets on day 1, take 5 tablets on day 2, take 4 tablets on day 3, take 3 tablets on day 4, take 2 tablets on day 5, take 1 tablet on day 6 11/02/16   Enid DerryWagner, Ashley, PA-C    Allergies Penicillin g, Amoxicillin, Citalopram, Keflet [cephalexin], and Sulfur  Family History  Problem Relation Age of Onset  . Breast cancer Mother   . Kidney cancer Neg Hx   . Prostate cancer Neg Hx   . Bladder Cancer Neg Hx     Social History Social History   Tobacco Use  . Smoking status: Never Smoker  . Smokeless tobacco: Never Used  Substance Use Topics  . Alcohol use: No    Alcohol/week: 0.0 standard drinks  . Drug use: No     Review of Systems  Constitutional: No fever/chills Eyes: No visual changes. No discharge ENT: No upper respiratory complaints. Cardiovascular: no chest pain. Respiratory: no cough. No SOB. Gastrointestinal: No abdominal pain.  No nausea, no vomiting.  No diarrhea.  No constipation. Genitourinary: Negative for dysuria. No hematuria Musculoskeletal: Negative for musculoskeletal pain. Skin: Patient has laceration.  Neurological: Negative for headaches, focal weakness or numbness.   ____________________________________________   PHYSICAL EXAM:  VITAL SIGNS: ED Triage Vitals  Enc Vitals Group  BP 04/11/19 2225 (!) 147/96     Pulse Rate 04/11/19 2225 72     Resp 04/11/19 2225 16     Temp --      Temp src --      SpO2 04/11/19 2225 100 %     Weight 04/11/19 2226 217 lb (98.4 kg)     Height 04/11/19 2226 6' (1.829 m)     Head Circumference --      Peak Flow --      Pain Score 04/11/19 2226 7     Pain Loc --      Pain Edu? --      Excl. in GC? --      Constitutional: Alert and oriented. Well appearing and in no acute distress. Eyes: Conjunctivae are normal. PERRL.  EOMI. Head: Atraumatic. Cardiovascular: Normal rate, regular rhythm. Normal S1 and S2.  Good peripheral circulation. Respiratory: Normal respiratory effort without tachypnea or retractions. Lungs CTAB. Good air entry to the bases with no decreased or absent breath sounds. Gastrointestinal: Bowel sounds 4 quadrants. Soft and nontender to palpation. No guarding or rigidity. No palpable masses. No distention. No CVA tenderness. Musculoskeletal: Full range of motion to all extremities. No gross deformities appreciated. Neurologic:  Normal speech and language. No gross focal neurologic deficits are appreciated.  Skin: Patient has 2 cm laceration sustained to the volar aspect of the right ring finger. Psychiatric: Mood and affect are normal. Speech and behavior are normal. Patient exhibits appropriate insight and judgement.   ____________________________________________   LABS (all labs ordered are listed, but only abnormal results are displayed)  Labs Reviewed - No data to display ____________________________________________  EKG   ____________________________________________  RADIOLOGY I personally viewed and evaluated these images as part of my medical decision making, as well as reviewing the written report by the radiologist.  Dg Finger Ring Right  Result Date: 04/11/2019 CLINICAL DATA:  Injury, possible fracture from heads Flipper EXAM: RIGHT RING FINGER 2+V COMPARISON:  Right hand radiograph 07/01/2007 FINDINGS: Soft tissue laceration at the base of the fourth digit. No subjacent fracture or osseous defect is identified. No radiopaque foreign body is evident. Alignment of the fourth digit and remaining osseous structures is unremarkable. IMPRESSION: Soft tissue laceration at the base of the fourth digit. No subjacent fracture or osseous defect. No radiopaque foreign body. Electronically Signed   By: Kreg Shropshire M.D.   On: 04/11/2019 23:01     ____________________________________________    PROCEDURES  Procedure(s) performed:    Procedures  LACERATION REPAIR Performed by: Orvil Feil Authorized by: Orvil Feil Consent: Verbal consent obtained. Risks and benefits: risks, benefits and alternatives were discussed Consent given by: patient Patient identity confirmed: provided demographic data Prepped and Draped in normal sterile fashion Wound explored  Laceration Location: Right ring finger.   Laceration Length: 2 cm  No Foreign Bodies seen or palpated  Anesthesia: local infiltration  Local anesthetic: lidocaine 1% without epinephrine  Anesthetic total: 2 ml  Irrigation method: syringe Amount of cleaning: standard  Skin closure: 4-0 Ethilon   Number of sutures: 4  Technique: Simple Interrupted   Patient tolerance: Patient tolerated the procedure well with no immediate complications.   Medications  lidocaine (XYLOCAINE) 1 % (with pres) injection 5 mL (has no administration in time range)     ____________________________________________   INITIAL IMPRESSION / ASSESSMENT AND PLAN / ED COURSE  Pertinent labs & imaging results that were available during my care of the patient were reviewed by  me and considered in my medical decision making (see chart for details).  Review of the [place] CSRS was performed in accordance of the [place] prior to dispensing any controlled drugs.           Assessment and plan Laceration:  39 year old male presents to the emergency department with a 2 cm laceration sustained to the volar aspect of the right fourth digit.  Laceration was repaired in the emergency department without complication.  Patient was advised to have external sutures removed by primary care in 7 days.  He was discharged with doxycycline due to allergies to penicillin, cephalosporins and Bactrim.  Return precautions were given.  All patient questions were  answered.    ____________________________________________  FINAL CLINICAL IMPRESSION(S) / ED DIAGNOSES  Final diagnoses:  Laceration of right ring finger without foreign body without damage to nail, initial encounter      NEW MEDICATIONS STARTED DURING THIS VISIT:  ED Discharge Orders         Ordered    doxycycline (ADOXA) 100 MG tablet  2 times daily     04/11/19 2259              This chart was dictated using voice recognition software/Dragon. Despite best efforts to proofread, errors can occur which can change the meaning. Any change was purely unintentional.    Lannie Fields, PA-C 04/11/19 2311    Duffy Bruce, MD 04/15/19 [phone]

## 2019-04-11 NOTE — ED Notes (Signed)
Pt came straight from radiology to treatment room

## 2019-04-11 NOTE — ED Notes (Signed)
PA at bedside already suturing laceration

## 2020-01-05 ENCOUNTER — Other Ambulatory Visit: Payer: Self-pay

## 2020-01-05 ENCOUNTER — Other Ambulatory Visit: Payer: Self-pay | Admitting: Family Medicine

## 2020-01-05 ENCOUNTER — Ambulatory Visit
Admission: RE | Admit: 2020-01-05 | Discharge: 2020-01-05 | Disposition: A | Discharge status: Home / Self Care | Payer: Medicaid Other | Source: Ambulatory Visit | Attending: Family Medicine | Admitting: Family Medicine

## 2020-01-05 DIAGNOSIS — K625 Hemorrhage of anus and rectum: Secondary | ICD-10-CM

## 2020-01-05 DIAGNOSIS — R1084 Generalized abdominal pain: Secondary | ICD-10-CM

## 2020-01-05 MED ORDER — IOHEXOL 300 MG/ML  SOLN
100.0000 mL | Freq: Once | INTRAMUSCULAR | Status: AC | PRN
  Administered 2020-01-05: 19:00:00 100 mL via INTRAVENOUS

## 2020-04-14 ENCOUNTER — Other Ambulatory Visit: Payer: Self-pay | Admitting: Internal Medicine

## 2020-04-14 DIAGNOSIS — R0609 Other forms of dyspnea: Secondary | ICD-10-CM

## 2020-04-14 DIAGNOSIS — R1010 Upper abdominal pain, unspecified: Secondary | ICD-10-CM

## 2020-04-14 DIAGNOSIS — R634 Abnormal weight loss: Secondary | ICD-10-CM

## 2020-04-19 ENCOUNTER — Ambulatory Visit: Admission: RE | Admit: 2020-04-19 | Payer: Medicaid Other | Source: Ambulatory Visit

## 2020-04-25 ENCOUNTER — Other Ambulatory Visit: Payer: Self-pay

## 2020-04-25 ENCOUNTER — Ambulatory Visit
Admission: RE | Admit: 2020-04-25 | Discharge: 2020-04-25 | Disposition: A | Discharge status: Home / Self Care | Payer: Medicaid Other | Source: Ambulatory Visit | Attending: Internal Medicine | Admitting: Internal Medicine

## 2020-04-25 DIAGNOSIS — R1010 Upper abdominal pain, unspecified: Secondary | ICD-10-CM | POA: Insufficient documentation

## 2020-04-25 DIAGNOSIS — R634 Abnormal weight loss: Secondary | ICD-10-CM | POA: Diagnosis present

## 2020-04-25 MED ORDER — IOHEXOL 300 MG/ML  SOLN
100.0000 mL | Freq: Once | INTRAMUSCULAR | Status: AC | PRN
  Administered 2020-04-25: 100 mL via INTRAVENOUS

## 2021-11-21 ENCOUNTER — Other Ambulatory Visit: Payer: Self-pay | Admitting: Internal Medicine

## 2021-11-21 DIAGNOSIS — R079 Chest pain, unspecified: Secondary | ICD-10-CM

## 2021-12-28 ENCOUNTER — Ambulatory Visit
Admission: RE | Admit: 2021-12-28 | Discharge: 2021-12-28 | Disposition: A | Discharge status: Home / Self Care | Payer: Medicaid Other | Source: Ambulatory Visit | Attending: Internal Medicine | Admitting: Internal Medicine

## 2021-12-28 DIAGNOSIS — R079 Chest pain, unspecified: Secondary | ICD-10-CM

## 2023-08-20 IMAGING — CT CT CARDIAC CORONARY ARTERY CALCIUM SCORE
3 series · 14 of 20 positions shown, 16 images · non-contrast
Comparison: None Available.

CLINICAL DATA: A 40-year-old male presents for evaluation of
coronary artery calcium.

EXAM:
CT CARDIAC CORONARY ARTERY CALCIUM SCORE
TECHNIQUE: Non-contrast imaging through the heart was performed using
prospective ECG gating. Image post processing was performed on an
independent workstation, allowing for quantitative analysis of the
heart and coronary arteries. Note that this exam targets the heart
and the chest was not imaged in its entirety.

[Series 2: calcium scoring 2.00 qr36 bestdiast 67% hrt calciu · axial · 0.40mm/px · z∈[+1648,+1740]mm · 4 of 78 slices shown]
[im 16/78  vessel]
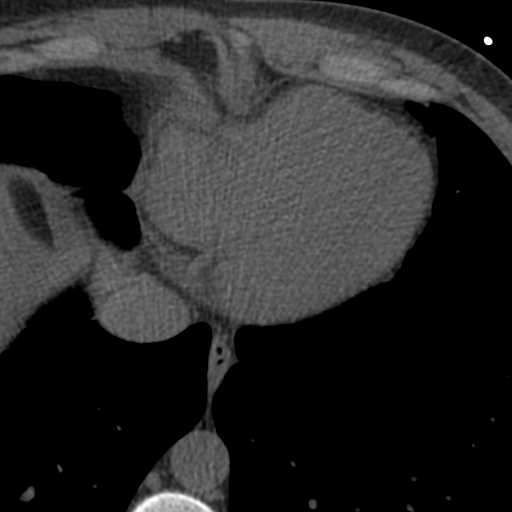
[im 31/78  vessel]
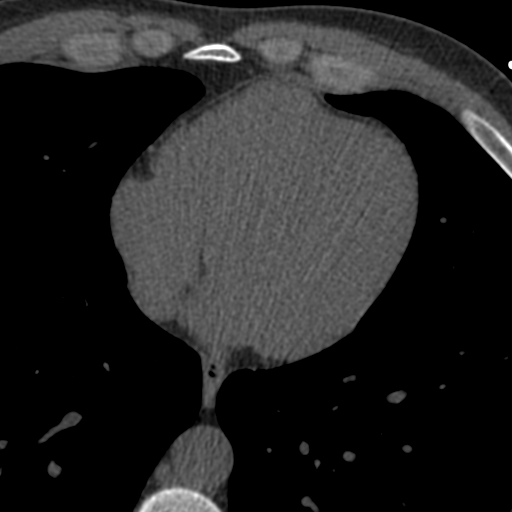
[im 47/78  vessel]
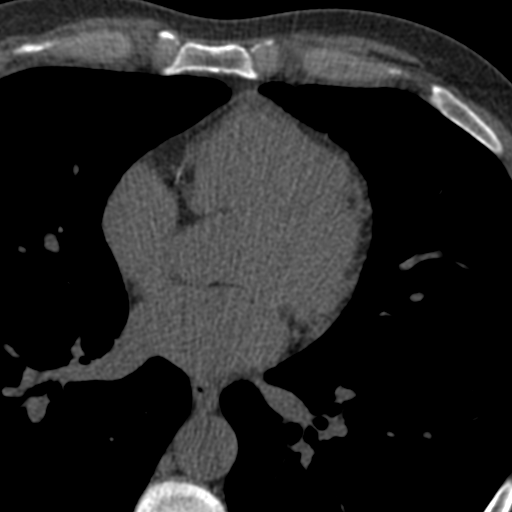
[im 62/78  vessel]
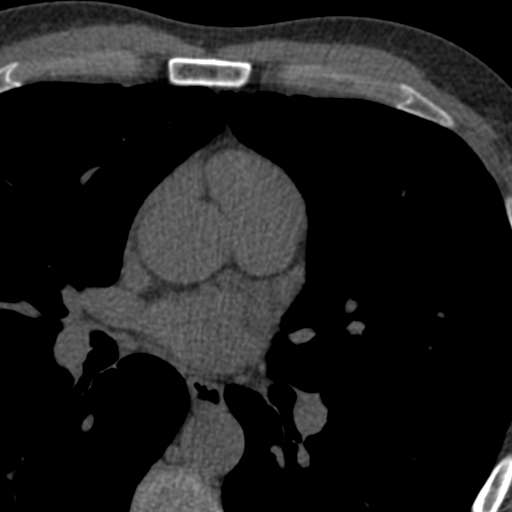

[Series 3: calcium scoring 2.00 br40 bestdiast 67% axial · axial · 0.61mm/px · z∈[+1642,+1746]mm · 5 of 78 slices shown, 7 images]
[im 13/78  vessel]
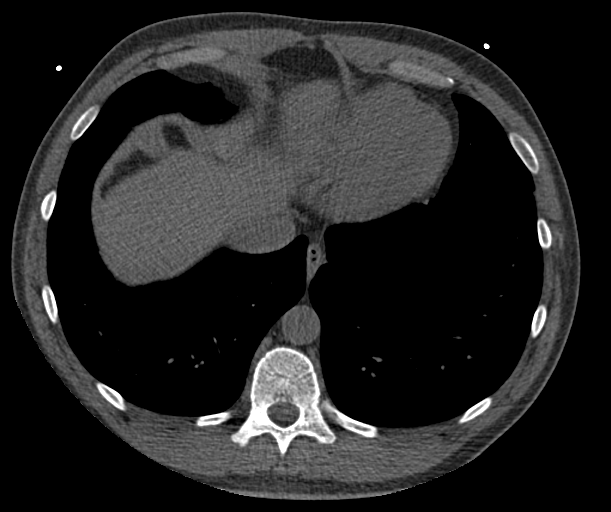
[im 13/78  lung]
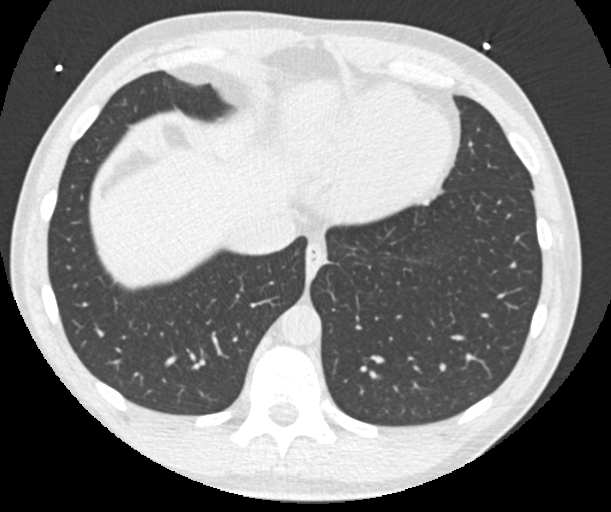
[im 26/78  vessel]
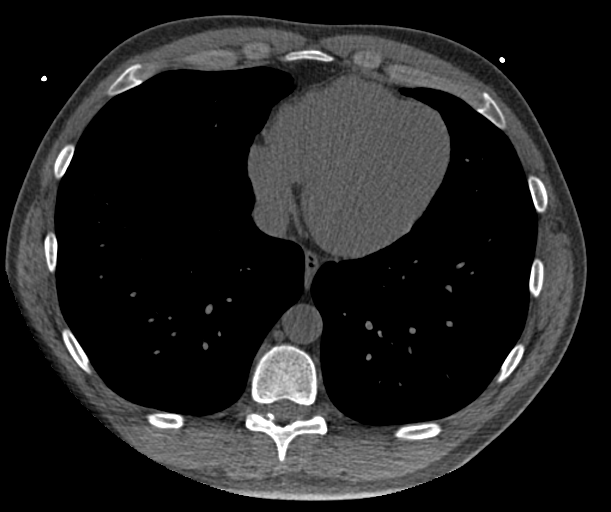
[im 39/78  vessel]
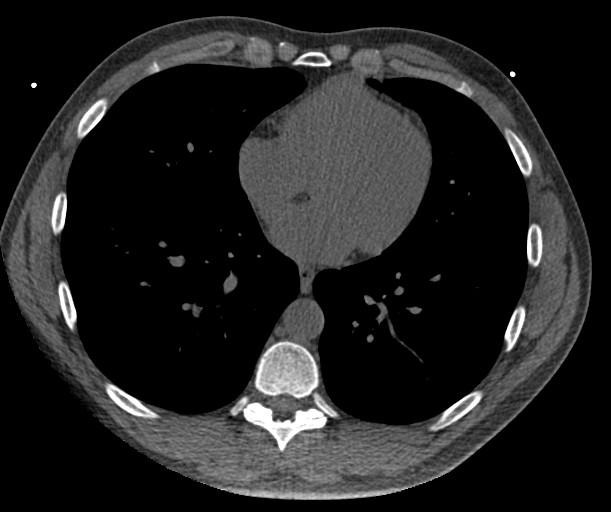
[im 52/78  vessel]
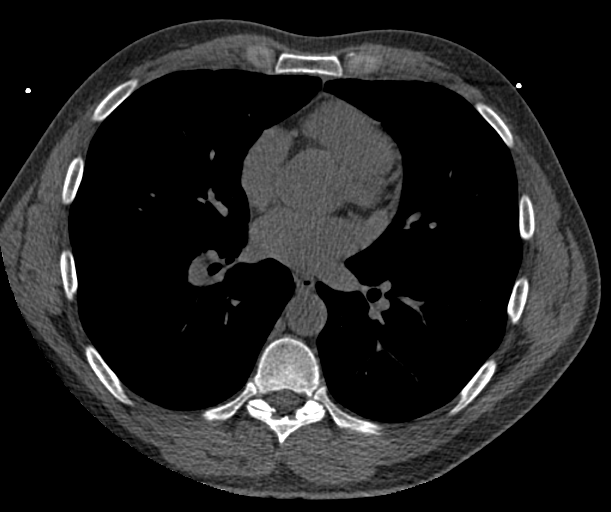
[im 65/78  vessel]
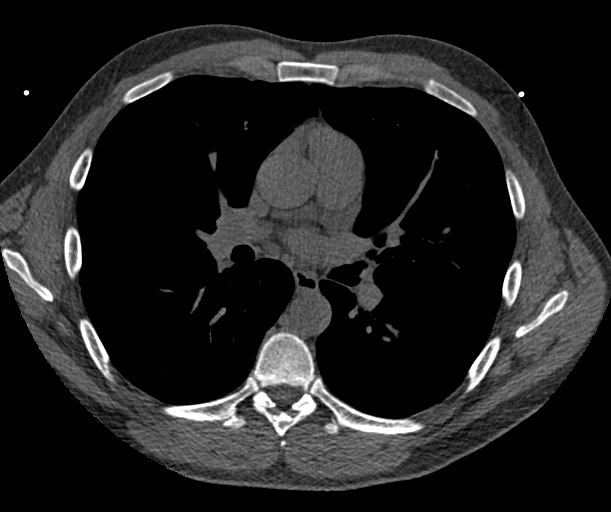
[im 65/78  lung]
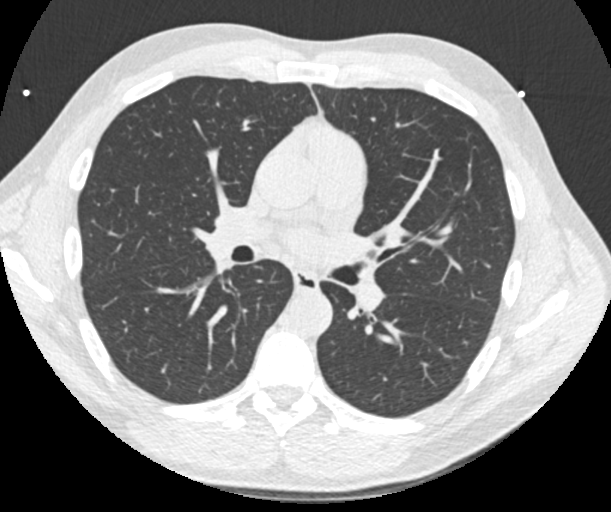

[Series 9: calcium scoring 2.00 br60 bestdiast 67% lungs · axial · 0.61mm/px · z∈[+1642,+1746]mm · 5 of 78 slices shown]
[im 13/78  vessel]
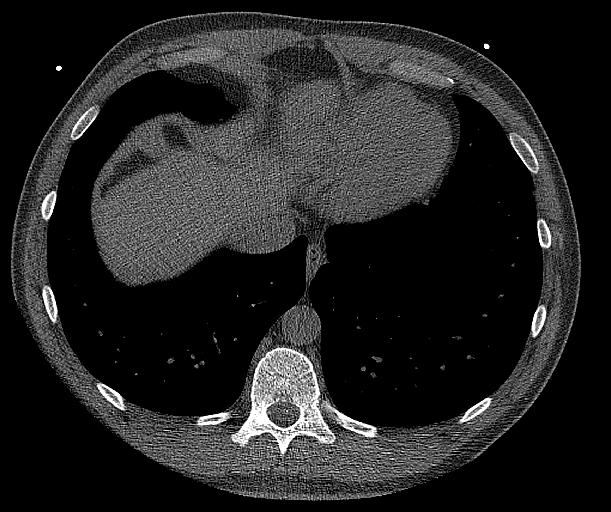
[im 26/78  vessel]
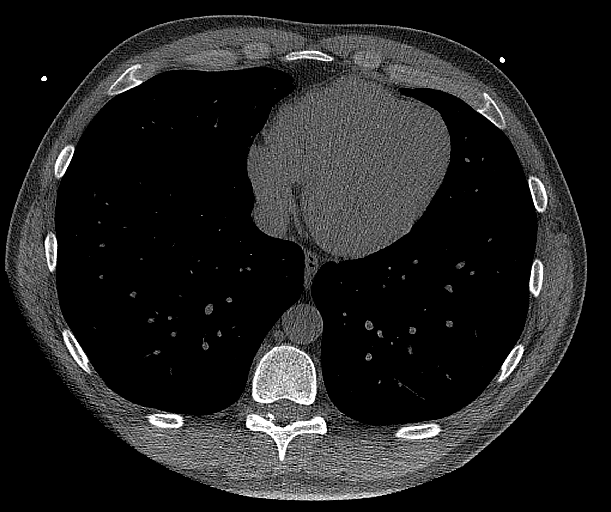
[im 39/78  vessel]
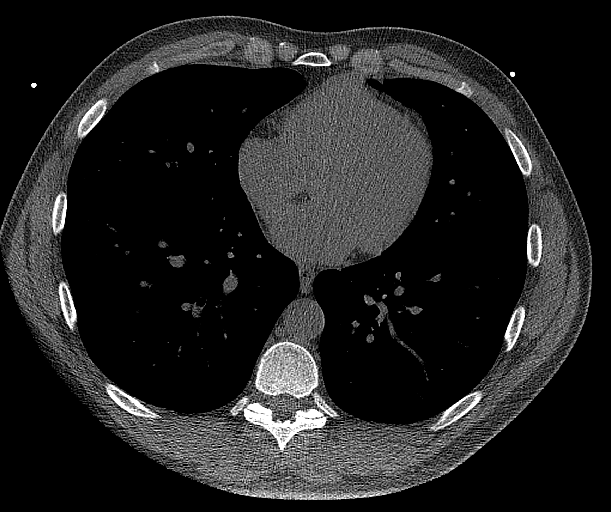
[im 52/78  vessel]
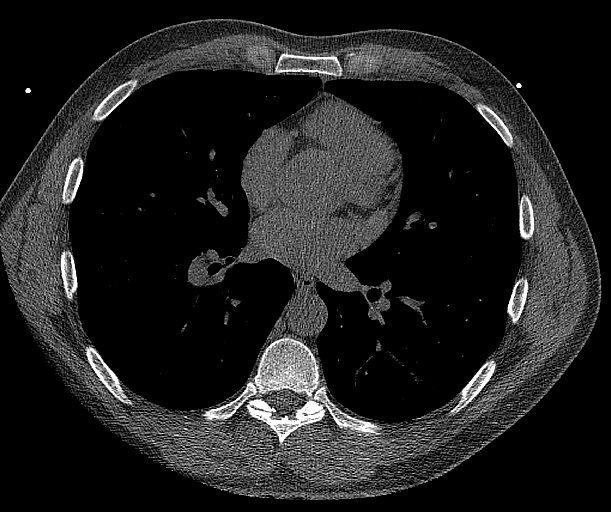
[im 65/78  vessel]
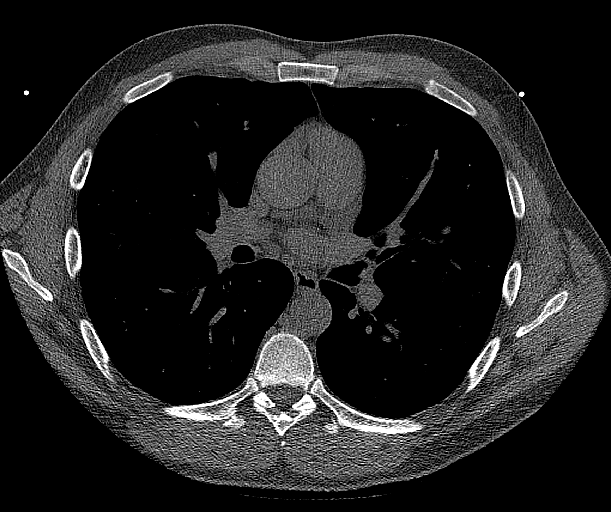

[14 of 20 positions shown; findings below may reference images not displayed]

FINDINGS: CORONARY CALCIUM SCORES:

Left Main: 0

LAD:

LCx:

RCA:

Total Agatston Score:

[HOSPITAL] percentile: No establish standards for patients less
than 45 years of age in the [HOSPITAL].

The if this patient were 46 years of age this would place him at the
eighty-ninth percentile for patient's at 45 years of age.

AORTA MEASUREMENTS:

Ascending Aorta: 34 mm

Descending Aorta: 25 mm

OTHER FINDINGS:

Cardiovascular: Limited imaging of the heart great vessels on
noncontrast evaluation with incomplete imaging of the chest,
confined to mid chest and cardiac structures only is unremarkable.

Mediastinum/Nodes: No adenopathy or acute process in visualized
portions of the mediastinum.

Lungs/Pleura: Visualized lungs are clear and visualized airways are
patent with imaging confined again to the mid chest and cardiac
structures on today's evaluation.

Upper Abdomen: Incidental imaging of upper abdominal contents shows
no acute findings.

Musculoskeletal: No acute or destructive bone process.
IMPRESSION: 1. Coronary calcium score 35.3 which is age advanced but without
standards in the [HOSPITAL]. Please see above.

## 2023-11-14 ENCOUNTER — Other Ambulatory Visit: Payer: Self-pay | Admitting: Internal Medicine

## 2023-11-14 ENCOUNTER — Ambulatory Visit
Admission: RE | Admit: 2023-11-14 | Discharge: 2023-11-14 | Disposition: A | Discharge status: Home / Self Care | Source: Ambulatory Visit | Attending: Internal Medicine | Admitting: Internal Medicine

## 2023-11-14 DIAGNOSIS — D496 Neoplasm of unspecified behavior of brain: Secondary | ICD-10-CM

## 2023-11-14 DIAGNOSIS — I639 Cerebral infarction, unspecified: Secondary | ICD-10-CM | POA: Insufficient documentation

## 2023-11-14 MED ORDER — GADOBUTROL 1 MMOL/ML IV SOLN
7.5000 mL | Freq: Once | INTRAVENOUS | Status: AC | PRN
  Administered 2023-11-14: 7.5 mL via INTRAVENOUS
